# Patient Record
Sex: Female | Born: 1990 | Race: Asian | Hispanic: No | Marital: Married | Smoking: Former smoker
Health system: Southern US, Community
[De-identification: ages and names within clinical notes are randomized; demographics above are authoritative.]

## PROBLEM LIST (undated history)

## (undated) DIAGNOSIS — Z789 Other specified health status: Secondary | ICD-10-CM

## (undated) DIAGNOSIS — N9489 Other specified conditions associated with female genital organs and menstrual cycle: Secondary | ICD-10-CM

## (undated) DIAGNOSIS — I8390 Asymptomatic varicose veins of unspecified lower extremity: Secondary | ICD-10-CM

## (undated) HISTORY — PX: NO PAST SURGERIES: SHX2092

---

## 2015-12-24 ENCOUNTER — Other Ambulatory Visit (HOSPITAL_COMMUNITY): Payer: Self-pay | Admitting: Obstetrics and Gynecology

## 2015-12-24 ENCOUNTER — Encounter (HOSPITAL_COMMUNITY): Payer: Self-pay | Admitting: Obstetrics and Gynecology

## 2015-12-24 DIAGNOSIS — Z3689 Encounter for other specified antenatal screening: Secondary | ICD-10-CM

## 2015-12-24 DIAGNOSIS — O30032 Twin pregnancy, monochorionic/diamniotic, second trimester: Secondary | ICD-10-CM

## 2016-02-19 ENCOUNTER — Encounter (HOSPITAL_COMMUNITY): Payer: Self-pay | Admitting: Obstetrics and Gynecology

## 2016-03-03 ENCOUNTER — Encounter (HOSPITAL_COMMUNITY): Payer: Self-pay | Admitting: *Deleted

## 2016-03-04 ENCOUNTER — Ambulatory Visit (HOSPITAL_COMMUNITY)
Admission: RE | Admit: 2016-03-04 | Discharge: 2016-03-04 | Disposition: A | Discharge status: Home / Self Care | Payer: Medicaid Other | Source: Ambulatory Visit | Attending: Obstetrics and Gynecology | Admitting: Obstetrics and Gynecology

## 2016-03-04 ENCOUNTER — Ambulatory Visit (HOSPITAL_COMMUNITY): Payer: Medicaid Other

## 2016-03-04 ENCOUNTER — Other Ambulatory Visit (HOSPITAL_COMMUNITY): Payer: Self-pay | Admitting: *Deleted

## 2016-03-04 ENCOUNTER — Other Ambulatory Visit (HOSPITAL_COMMUNITY): Payer: Self-pay | Admitting: Obstetrics and Gynecology

## 2016-03-04 ENCOUNTER — Encounter (HOSPITAL_COMMUNITY): Payer: Self-pay

## 2016-03-04 DIAGNOSIS — O30032 Twin pregnancy, monochorionic/diamniotic, second trimester: Secondary | ICD-10-CM

## 2016-03-04 DIAGNOSIS — D582 Other hemoglobinopathies: Secondary | ICD-10-CM

## 2016-03-04 DIAGNOSIS — O30039 Twin pregnancy, monochorionic/diamniotic, unspecified trimester: Secondary | ICD-10-CM

## 2016-03-04 DIAGNOSIS — Z3689 Encounter for other specified antenatal screening: Secondary | ICD-10-CM

## 2016-03-04 DIAGNOSIS — Z3A19 19 weeks gestation of pregnancy: Secondary | ICD-10-CM | POA: Diagnosis not present

## 2016-03-04 DIAGNOSIS — Z363 Encounter for antenatal screening for malformations: Secondary | ICD-10-CM | POA: Diagnosis not present

## 2016-03-04 HISTORY — DX: Asymptomatic varicose veins of unspecified lower extremity: I83.90

## 2016-03-04 HISTORY — DX: Other specified health status: Z78.9

## 2016-03-05 ENCOUNTER — Encounter (HOSPITAL_COMMUNITY): Payer: Self-pay

## 2016-03-18 ENCOUNTER — Ambulatory Visit (HOSPITAL_COMMUNITY)
Admission: RE | Admit: 2016-03-18 | Discharge: 2016-03-18 | Disposition: A | Discharge status: Home / Self Care | Payer: Medicaid Other | Source: Ambulatory Visit | Attending: Obstetrics and Gynecology | Admitting: Obstetrics and Gynecology

## 2016-03-18 ENCOUNTER — Encounter (HOSPITAL_COMMUNITY): Payer: Self-pay

## 2016-03-18 DIAGNOSIS — O30032 Twin pregnancy, monochorionic/diamniotic, second trimester: Secondary | ICD-10-CM | POA: Diagnosis present

## 2016-03-18 DIAGNOSIS — Z363 Encounter for antenatal screening for malformations: Secondary | ICD-10-CM | POA: Insufficient documentation

## 2016-03-18 DIAGNOSIS — Z3A21 21 weeks gestation of pregnancy: Secondary | ICD-10-CM | POA: Diagnosis not present

## 2016-03-18 DIAGNOSIS — O30039 Twin pregnancy, monochorionic/diamniotic, unspecified trimester: Secondary | ICD-10-CM

## 2016-04-01 ENCOUNTER — Encounter (HOSPITAL_COMMUNITY): Payer: Self-pay

## 2016-04-01 ENCOUNTER — Ambulatory Visit (HOSPITAL_COMMUNITY)
Admission: RE | Admit: 2016-04-01 | Discharge: 2016-04-01 | Disposition: A | Discharge status: Home / Self Care | Payer: Medicaid Other | Source: Ambulatory Visit | Attending: Obstetrics and Gynecology | Admitting: Obstetrics and Gynecology

## 2016-04-01 DIAGNOSIS — Z362 Encounter for other antenatal screening follow-up: Secondary | ICD-10-CM | POA: Insufficient documentation

## 2016-04-01 DIAGNOSIS — Z363 Encounter for antenatal screening for malformations: Secondary | ICD-10-CM | POA: Diagnosis not present

## 2016-04-01 DIAGNOSIS — Z3A23 23 weeks gestation of pregnancy: Secondary | ICD-10-CM | POA: Insufficient documentation

## 2016-04-01 DIAGNOSIS — O30032 Twin pregnancy, monochorionic/diamniotic, second trimester: Secondary | ICD-10-CM | POA: Diagnosis not present

## 2016-04-01 DIAGNOSIS — O30039 Twin pregnancy, monochorionic/diamniotic, unspecified trimester: Secondary | ICD-10-CM

## 2016-04-15 ENCOUNTER — Ambulatory Visit (HOSPITAL_COMMUNITY)
Admission: RE | Admit: 2016-04-15 | Discharge: 2016-04-15 | Disposition: A | Discharge status: Home / Self Care | Payer: Medicaid Other | Source: Ambulatory Visit | Attending: Obstetrics and Gynecology | Admitting: Obstetrics and Gynecology

## 2016-04-15 ENCOUNTER — Encounter (HOSPITAL_COMMUNITY): Payer: Self-pay

## 2016-04-15 DIAGNOSIS — O30032 Twin pregnancy, monochorionic/diamniotic, second trimester: Secondary | ICD-10-CM | POA: Insufficient documentation

## 2016-04-15 DIAGNOSIS — Z362 Encounter for other antenatal screening follow-up: Secondary | ICD-10-CM | POA: Insufficient documentation

## 2016-04-15 DIAGNOSIS — Z3A25 25 weeks gestation of pregnancy: Secondary | ICD-10-CM | POA: Insufficient documentation

## 2016-04-15 DIAGNOSIS — O30039 Twin pregnancy, monochorionic/diamniotic, unspecified trimester: Secondary | ICD-10-CM

## 2016-04-15 DIAGNOSIS — Z363 Encounter for antenatal screening for malformations: Secondary | ICD-10-CM | POA: Insufficient documentation

## 2016-04-16 ENCOUNTER — Other Ambulatory Visit (HOSPITAL_COMMUNITY): Payer: Self-pay | Admitting: *Deleted

## 2016-04-16 DIAGNOSIS — O30039 Twin pregnancy, monochorionic/diamniotic, unspecified trimester: Secondary | ICD-10-CM

## 2016-04-29 ENCOUNTER — Encounter (HOSPITAL_COMMUNITY): Payer: Self-pay

## 2016-04-29 ENCOUNTER — Other Ambulatory Visit (HOSPITAL_COMMUNITY): Payer: Self-pay | Admitting: Maternal and Fetal Medicine

## 2016-04-29 ENCOUNTER — Ambulatory Visit (HOSPITAL_COMMUNITY)
Admission: RE | Admit: 2016-04-29 | Discharge: 2016-04-29 | Disposition: A | Discharge status: Home / Self Care | Payer: Medicaid Other | Source: Ambulatory Visit | Attending: Obstetrics and Gynecology | Admitting: Obstetrics and Gynecology

## 2016-04-29 DIAGNOSIS — O30039 Twin pregnancy, monochorionic/diamniotic, unspecified trimester: Secondary | ICD-10-CM

## 2016-04-29 DIAGNOSIS — Z3A27 27 weeks gestation of pregnancy: Secondary | ICD-10-CM | POA: Insufficient documentation

## 2016-04-29 DIAGNOSIS — O30032 Twin pregnancy, monochorionic/diamniotic, second trimester: Secondary | ICD-10-CM | POA: Diagnosis not present

## 2016-05-13 ENCOUNTER — Other Ambulatory Visit (HOSPITAL_COMMUNITY): Payer: Self-pay | Admitting: Maternal and Fetal Medicine

## 2016-05-13 ENCOUNTER — Encounter (HOSPITAL_COMMUNITY): Payer: Self-pay

## 2016-05-13 ENCOUNTER — Ambulatory Visit (HOSPITAL_COMMUNITY)
Admission: RE | Admit: 2016-05-13 | Discharge: 2016-05-13 | Disposition: A | Discharge status: Home / Self Care | Payer: Medicaid Other | Source: Ambulatory Visit | Attending: Obstetrics and Gynecology | Admitting: Obstetrics and Gynecology

## 2016-05-13 DIAGNOSIS — O365931 Maternal care for other known or suspected poor fetal growth, third trimester, fetus 1: Secondary | ICD-10-CM | POA: Diagnosis not present

## 2016-05-13 DIAGNOSIS — O30033 Twin pregnancy, monochorionic/diamniotic, third trimester: Secondary | ICD-10-CM | POA: Insufficient documentation

## 2016-05-13 DIAGNOSIS — O30039 Twin pregnancy, monochorionic/diamniotic, unspecified trimester: Secondary | ICD-10-CM

## 2016-05-13 DIAGNOSIS — Z3A29 29 weeks gestation of pregnancy: Secondary | ICD-10-CM | POA: Insufficient documentation

## 2016-05-13 DIAGNOSIS — O365932 Maternal care for other known or suspected poor fetal growth, third trimester, fetus 2: Secondary | ICD-10-CM | POA: Diagnosis not present

## 2016-05-14 ENCOUNTER — Other Ambulatory Visit (HOSPITAL_COMMUNITY): Payer: Self-pay | Admitting: *Deleted

## 2016-05-20 ENCOUNTER — Encounter (HOSPITAL_COMMUNITY): Payer: Self-pay

## 2016-05-20 ENCOUNTER — Other Ambulatory Visit (HOSPITAL_COMMUNITY): Payer: Medicaid Other

## 2016-05-20 ENCOUNTER — Ambulatory Visit (HOSPITAL_COMMUNITY)
Admission: RE | Admit: 2016-05-20 | Discharge: 2016-05-20 | Disposition: A | Discharge status: Home / Self Care | Payer: Medicaid Other | Source: Ambulatory Visit | Attending: Obstetrics and Gynecology | Admitting: Obstetrics and Gynecology

## 2016-05-20 ENCOUNTER — Other Ambulatory Visit (HOSPITAL_COMMUNITY): Payer: Self-pay | Admitting: Maternal and Fetal Medicine

## 2016-05-20 DIAGNOSIS — O30033 Twin pregnancy, monochorionic/diamniotic, third trimester: Secondary | ICD-10-CM

## 2016-05-20 DIAGNOSIS — O365932 Maternal care for other known or suspected poor fetal growth, third trimester, fetus 2: Secondary | ICD-10-CM | POA: Insufficient documentation

## 2016-05-20 DIAGNOSIS — Z3A30 30 weeks gestation of pregnancy: Secondary | ICD-10-CM | POA: Insufficient documentation

## 2016-05-20 DIAGNOSIS — O365931 Maternal care for other known or suspected poor fetal growth, third trimester, fetus 1: Secondary | ICD-10-CM | POA: Diagnosis not present

## 2016-05-27 ENCOUNTER — Ambulatory Visit (HOSPITAL_COMMUNITY): Payer: Medicaid Other

## 2016-05-27 ENCOUNTER — Encounter (HOSPITAL_COMMUNITY): Payer: Self-pay

## 2016-05-27 ENCOUNTER — Ambulatory Visit (HOSPITAL_COMMUNITY)
Admission: RE | Admit: 2016-05-27 | Discharge: 2016-05-27 | Disposition: A | Discharge status: Home / Self Care | Payer: Medicaid Other | Source: Ambulatory Visit | Attending: Obstetrics and Gynecology | Admitting: Obstetrics and Gynecology

## 2016-05-27 DIAGNOSIS — O365931 Maternal care for other known or suspected poor fetal growth, third trimester, fetus 1: Secondary | ICD-10-CM | POA: Diagnosis present

## 2016-05-27 DIAGNOSIS — O30033 Twin pregnancy, monochorionic/diamniotic, third trimester: Secondary | ICD-10-CM | POA: Diagnosis not present

## 2016-05-27 DIAGNOSIS — Z3A31 31 weeks gestation of pregnancy: Secondary | ICD-10-CM | POA: Insufficient documentation

## 2016-05-27 DIAGNOSIS — O365932 Maternal care for other known or suspected poor fetal growth, third trimester, fetus 2: Secondary | ICD-10-CM | POA: Diagnosis present

## 2016-06-03 ENCOUNTER — Ambulatory Visit (HOSPITAL_COMMUNITY): Payer: Medicaid Other

## 2016-06-03 ENCOUNTER — Encounter (HOSPITAL_COMMUNITY): Payer: Self-pay

## 2016-06-03 ENCOUNTER — Ambulatory Visit (HOSPITAL_COMMUNITY)
Admission: RE | Admit: 2016-06-03 | Discharge: 2016-06-03 | Disposition: A | Discharge status: Home / Self Care | Payer: Medicaid Other | Source: Ambulatory Visit | Attending: Obstetrics and Gynecology | Admitting: Obstetrics and Gynecology

## 2016-06-03 DIAGNOSIS — O365931 Maternal care for other known or suspected poor fetal growth, third trimester, fetus 1: Secondary | ICD-10-CM | POA: Insufficient documentation

## 2016-06-03 DIAGNOSIS — Z3A32 32 weeks gestation of pregnancy: Secondary | ICD-10-CM | POA: Diagnosis not present

## 2016-06-03 DIAGNOSIS — O30033 Twin pregnancy, monochorionic/diamniotic, third trimester: Secondary | ICD-10-CM | POA: Insufficient documentation

## 2016-06-03 DIAGNOSIS — O365932 Maternal care for other known or suspected poor fetal growth, third trimester, fetus 2: Secondary | ICD-10-CM | POA: Diagnosis not present

## 2016-06-03 DIAGNOSIS — O30039 Twin pregnancy, monochorionic/diamniotic, unspecified trimester: Secondary | ICD-10-CM

## 2016-06-03 NOTE — Addendum Note (Signed)
Encounter addended by: Vivien Rotaachael H Ruie Sendejo, RT on: 06/03/2016  4:21 PM<BR>    Actions taken: Imaging Exam ended

## 2016-06-03 NOTE — Addendum Note (Signed)
Encounter addended by: Vivien Rotaachael H Vale Peraza, RT on: 06/03/2016  4:20 PM<BR>    Actions taken: Imaging Exam ended

## 2016-06-10 ENCOUNTER — Encounter (HOSPITAL_COMMUNITY): Payer: Self-pay

## 2016-06-10 ENCOUNTER — Other Ambulatory Visit (HOSPITAL_COMMUNITY): Payer: Medicaid Other

## 2016-06-10 ENCOUNTER — Ambulatory Visit (HOSPITAL_COMMUNITY): Payer: Medicaid Other

## 2016-06-10 ENCOUNTER — Ambulatory Visit (HOSPITAL_COMMUNITY)
Admission: RE | Admit: 2016-06-10 | Discharge: 2016-06-10 | Disposition: A | Discharge status: Home / Self Care | Payer: Medicaid Other | Source: Ambulatory Visit | Attending: Obstetrics and Gynecology | Admitting: Obstetrics and Gynecology

## 2016-06-10 ENCOUNTER — Other Ambulatory Visit (HOSPITAL_COMMUNITY): Payer: Self-pay | Admitting: Maternal and Fetal Medicine

## 2016-06-10 DIAGNOSIS — Z3A33 33 weeks gestation of pregnancy: Secondary | ICD-10-CM | POA: Diagnosis not present

## 2016-06-10 DIAGNOSIS — O365990 Maternal care for other known or suspected poor fetal growth, unspecified trimester, not applicable or unspecified: Secondary | ICD-10-CM

## 2016-06-10 DIAGNOSIS — O30033 Twin pregnancy, monochorionic/diamniotic, third trimester: Secondary | ICD-10-CM | POA: Diagnosis present

## 2016-06-10 DIAGNOSIS — O365931 Maternal care for other known or suspected poor fetal growth, third trimester, fetus 1: Secondary | ICD-10-CM | POA: Diagnosis not present

## 2016-06-10 DIAGNOSIS — O365932 Maternal care for other known or suspected poor fetal growth, third trimester, fetus 2: Secondary | ICD-10-CM | POA: Diagnosis not present

## 2016-06-17 ENCOUNTER — Other Ambulatory Visit (HOSPITAL_COMMUNITY): Payer: Medicaid Other

## 2016-06-17 ENCOUNTER — Encounter (HOSPITAL_COMMUNITY): Payer: Self-pay

## 2016-06-17 ENCOUNTER — Other Ambulatory Visit (HOSPITAL_COMMUNITY): Payer: Self-pay | Admitting: Maternal and Fetal Medicine

## 2016-06-17 ENCOUNTER — Ambulatory Visit (HOSPITAL_COMMUNITY)
Admission: RE | Admit: 2016-06-17 | Discharge: 2016-06-17 | Disposition: A | Discharge status: Home / Self Care | Payer: Medicaid Other | Source: Ambulatory Visit | Attending: Obstetrics and Gynecology | Admitting: Obstetrics and Gynecology

## 2016-06-17 DIAGNOSIS — O365931 Maternal care for other known or suspected poor fetal growth, third trimester, fetus 1: Secondary | ICD-10-CM | POA: Insufficient documentation

## 2016-06-17 DIAGNOSIS — Z3A34 34 weeks gestation of pregnancy: Secondary | ICD-10-CM | POA: Insufficient documentation

## 2016-06-17 DIAGNOSIS — Z3689 Encounter for other specified antenatal screening: Secondary | ICD-10-CM

## 2016-06-17 DIAGNOSIS — Z3A35 35 weeks gestation of pregnancy: Secondary | ICD-10-CM

## 2016-06-17 DIAGNOSIS — O365932 Maternal care for other known or suspected poor fetal growth, third trimester, fetus 2: Secondary | ICD-10-CM | POA: Insufficient documentation

## 2016-06-17 DIAGNOSIS — O30033 Twin pregnancy, monochorionic/diamniotic, third trimester: Secondary | ICD-10-CM | POA: Diagnosis present

## 2016-06-24 ENCOUNTER — Encounter (HOSPITAL_COMMUNITY): Payer: Self-pay

## 2016-06-24 ENCOUNTER — Other Ambulatory Visit (HOSPITAL_COMMUNITY): Payer: Medicaid Other

## 2016-06-24 ENCOUNTER — Ambulatory Visit (HOSPITAL_COMMUNITY)
Admission: RE | Admit: 2016-06-24 | Discharge: 2016-06-24 | Disposition: A | Discharge status: Home / Self Care | Payer: Medicaid Other | Source: Ambulatory Visit | Attending: Obstetrics and Gynecology | Admitting: Obstetrics and Gynecology

## 2016-06-24 DIAGNOSIS — Z3689 Encounter for other specified antenatal screening: Secondary | ICD-10-CM

## 2016-06-24 DIAGNOSIS — O365931 Maternal care for other known or suspected poor fetal growth, third trimester, fetus 1: Secondary | ICD-10-CM | POA: Diagnosis not present

## 2016-06-24 DIAGNOSIS — O30033 Twin pregnancy, monochorionic/diamniotic, third trimester: Secondary | ICD-10-CM | POA: Diagnosis not present

## 2016-06-24 DIAGNOSIS — O365932 Maternal care for other known or suspected poor fetal growth, third trimester, fetus 2: Secondary | ICD-10-CM | POA: Diagnosis not present

## 2016-06-24 DIAGNOSIS — Z3A35 35 weeks gestation of pregnancy: Secondary | ICD-10-CM | POA: Insufficient documentation

## 2016-11-25 ENCOUNTER — Encounter (HOSPITAL_BASED_OUTPATIENT_CLINIC_OR_DEPARTMENT_OTHER): Payer: Self-pay | Admitting: *Deleted

## 2016-11-25 ENCOUNTER — Emergency Department (HOSPITAL_BASED_OUTPATIENT_CLINIC_OR_DEPARTMENT_OTHER)
Admission: EM | Admit: 2016-11-25 | Discharge: 2016-11-25 | Disposition: A | Discharge status: Home / Self Care | Payer: Medicaid Other | Attending: Emergency Medicine | Admitting: Emergency Medicine

## 2016-11-25 DIAGNOSIS — R1032 Left lower quadrant pain: Secondary | ICD-10-CM | POA: Insufficient documentation

## 2016-11-25 DIAGNOSIS — N760 Acute vaginitis: Secondary | ICD-10-CM | POA: Insufficient documentation

## 2016-11-25 DIAGNOSIS — M549 Dorsalgia, unspecified: Secondary | ICD-10-CM | POA: Diagnosis present

## 2016-11-25 DIAGNOSIS — R509 Fever, unspecified: Secondary | ICD-10-CM | POA: Diagnosis not present

## 2016-11-25 DIAGNOSIS — B9689 Other specified bacterial agents as the cause of diseases classified elsewhere: Secondary | ICD-10-CM

## 2016-11-25 LAB — URINALYSIS, MICROSCOPIC (REFLEX): RBC / HPF: NONE SEEN RBC/hpf (ref 0–5)

## 2016-11-25 LAB — WET PREP, GENITAL
Sperm: NONE SEEN
Trich, Wet Prep: NONE SEEN
YEAST WET PREP: NONE SEEN

## 2016-11-25 LAB — URINALYSIS, ROUTINE W REFLEX MICROSCOPIC
BILIRUBIN URINE: NEGATIVE
Glucose, UA: NEGATIVE mg/dL
HGB URINE DIPSTICK: NEGATIVE
Ketones, ur: NEGATIVE mg/dL
Nitrite: NEGATIVE
PH: 7.5 (ref 5.0–8.0)
Protein, ur: NEGATIVE mg/dL
SPECIFIC GRAVITY, URINE: 1.018 (ref 1.005–1.030)

## 2016-11-25 LAB — PREGNANCY, URINE: Preg Test, Ur: NEGATIVE

## 2016-11-25 MED ORDER — METRONIDAZOLE 500 MG PO TABS: 500.0000 mg | ORAL_TABLET | Freq: Two times a day (BID) | ORAL | 0 refills | Status: AC

## 2016-11-25 NOTE — ED Triage Notes (Signed)
Pt reports ongoing back and lower abdominal pain x 4 months since vaginal delivery. States that she has seen her GYN for issue and they report that nothing is wrong and that it will improve. States that she had sex last night and had small amount of bleeding.

## 2016-11-25 NOTE — ED Notes (Signed)
ED Provider at bedside. 

## 2016-11-25 NOTE — Discharge Instructions (Signed)
You were evaluated for abdominal pain and back pain in the ED today and was found to have a vaginal infection called bacterial vaginosis.  Please take metronidazole for the next 7 days and follow up with your OBGYN as outpatient.

## 2016-11-25 NOTE — ED Provider Notes (Signed)
[facility]   CSN: [id number] Arrival date & time: 11/25/16  1823     History   Chief Complaint Chief Complaint  Patient presents with  . Back Pain    HPI Ashlee Bowen is a 26 y.o. female.  Patient presents with back pain and abdominal pain in LLQ, constant for the past 4 months ever since delivery of her twin in March, sometimes worsens with lifting heavy things or bending down, causes her to have trouble sleeping. Also with yellowish malodorous vaginal discharge for 2 weeks that was initially itchy.  No known STD exposure. States her husbands also noticed post sexual intercourse bleeding but she is also expecting to start her period soon. She also states that she has had some fevers only at night for the last 3 days with Tmax 101.18F but does not currently have any fevers/chills. No dysuria, no urgency/frequency.       Past Medical History:  Diagnosis Date  . Medical history non-contributory   . Varicose vein of leg     There are no active problems to display for this patient.   Past Surgical History:  Procedure Laterality Date  . NO PAST SURGERIES      OB History    Gravida Para Term Preterm AB Living   3 1 1   1 1    SAB TAB Ectopic Multiple Live Births   1               Home Medications    Prior to Admission medications   Medication Sig Start Date End Date Taking? Authorizing Provider  IRON PO Take by mouth.    [provider]  Prenatal Vit-Fe Fumarate-FA (PRENATAL VITAMIN PO) Take by mouth.    [provider]  Terconazole (TERAZOL 3 VA) Place vaginally.    [provider]    Family History History reviewed. No pertinent family history.  Social History Social History  Substance Use Topics  . Smoking status: Never Smoker  . Smokeless tobacco: Never Used  . Alcohol use No     Allergies   Patient has no known allergies.   Review of Systems Review of Systems  Constitutional: Positive for fever.  Negative for chills.  HENT: Negative for congestion, rhinorrhea and sore throat.   Respiratory: Negative for chest tightness and shortness of breath.   Cardiovascular: Negative for chest pain.  Gastrointestinal: Positive for abdominal pain (LLQ ). Negative for constipation, diarrhea, nausea and vomiting.  Genitourinary: Positive for vaginal bleeding and vaginal discharge. Negative for dysuria, frequency, urgency and vaginal pain.  Musculoskeletal: Positive for back pain (L sided).  Skin: Negative for rash.  Neurological: Negative for dizziness, light-headedness and headaches.     Physical Exam Updated Vital Signs BP 115/71 (BP Location: Right Arm)   Pulse 62   Temp 98.8 F (37.1 C) (Oral)   Resp 16   Ht 5\' 3"  (1.6 m)   Wt 62 kg (136 lb 11 oz)   LMP 10/19/2015 (Approximate)   SpO2 100%   Breastfeeding? Unknown   BMI 24.21 kg/m   Physical Exam  Constitutional: She is oriented to person, place, and time. She appears well-developed and well-nourished. No distress.  HENT:  Head: Normocephalic and atraumatic.  Nose: Nose normal.  Mouth/Throat: Oropharynx is clear and moist.  Eyes: Conjunctivae and EOM are normal.  Neck: Normal range of motion. Neck supple.  Cardiovascular: Normal rate, regular rhythm, normal heart sounds and intact distal pulses.   No murmur heard.  Pulmonary/Chest: Effort normal and breath sounds normal. No respiratory distress.  Abdominal: Soft. Bowel sounds are normal. She exhibits no distension and no mass. There is no tenderness. There is no rebound and no guarding.  No CVA tenderness  Genitourinary: Vaginal discharge (yellow and vaginal bleeding) found.  Musculoskeletal: Normal range of motion.  Neurological: She is alert and oriented to person, place, and time.  Skin: Skin is warm and dry. Capillary refill takes less than 2 seconds.  Psychiatric: She has a normal mood and affect.     ED Treatments / Results  Labs (all labs ordered are listed, but  only abnormal results are displayed) Labs Reviewed  WET PREP, GENITAL - Abnormal; Notable for the following:       Result Value   Clue Cells Wet Prep HPF POC PRESENT (*)    WBC, Wet Prep HPF POC MANY (*)    All other components within normal limits  URINALYSIS, ROUTINE W REFLEX MICROSCOPIC - Abnormal; Notable for the following:    APPearance CLOUDY (*)    Leukocytes, UA SMALL (*)    All other components within normal limits  URINALYSIS, MICROSCOPIC (REFLEX) - Abnormal; Notable for the following:    Bacteria, UA RARE (*)    Squamous Epithelial / LPF 0-5 (*)    All other components within normal limits  PREGNANCY, URINE  GC/CHLAMYDIA PROBE AMP ([place]) NOT AT [facility]    EKG  EKG Interpretation None       Radiology No results found.  Procedures Procedures (including critical care time)  Medications Ordered in ED Medications - No data to display   Initial Impression / Assessment and Plan / ED Course  I have reviewed the triage vital signs and the nursing notes.  Pertinent labs & imaging results that were available during my care of the patient were reviewed by me and considered in my medical decision making (see chart for details).   Patient with chronic back and abdominal pain since delivery of her twins in March 2018. Now with vaginal discharge with wet prep c/w bacterial vaginosis. GC/chlamydia collected. Although she had h/o fever at home she is afebrile here in ED, has no CVA tenderness, no urinary symptoms and UA is not concerning for UTI. Rx for metronidazole given and patient instructed to follow up with her OBGYN.   Final Clinical Impressions(s) / ED Diagnoses   Final diagnoses:  Bacterial vaginosis    New Prescriptions Discharge Medication List as of 11/25/2016 10:50 PM    START taking these medications   Details  metroNIDAZOLE (FLAGYL) 500 MG tablet Take 1 tablet (500 mg total) by mouth 2 (two) times daily., Starting Wed 11/25/2016, Until Wed 12/02/2016,  Print         Leland Her, DO 11/26/16 0007    Tegeler, Canary Brim, MD 11/27/16 2147

## 2016-11-26 LAB — GC/CHLAMYDIA PROBE AMP (~~LOC~~) NOT AT ARMC
Chlamydia: NEGATIVE
Neisseria Gonorrhea: NEGATIVE

## 2018-03-03 IMAGING — US US MFM OB LIMITED
1 series · 14 of 28 positions shown · non-contrast
Comparison: none

[Series 1: us mfm ob limited · 61 acquisitions, 14 frames shown]
[im 3/61]
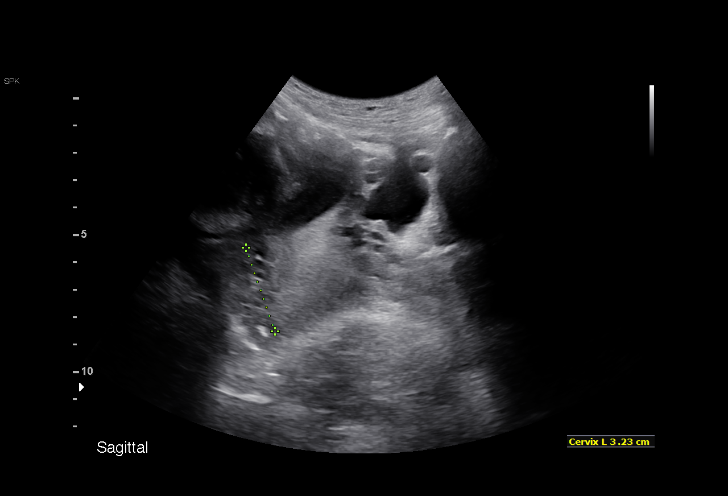
[im 7/61]
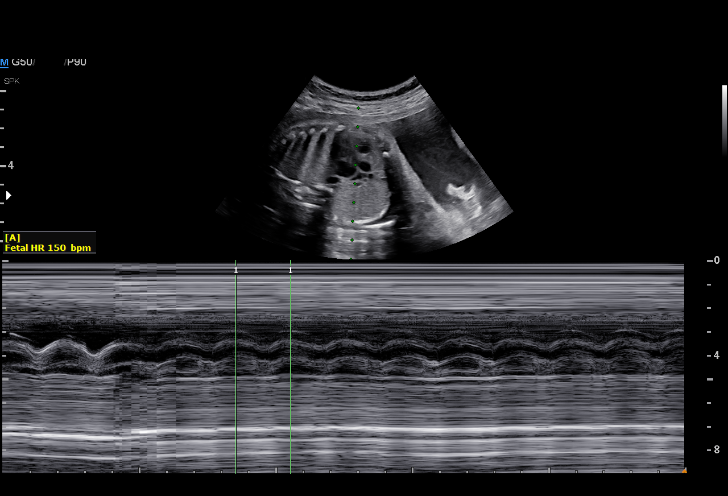
[im 12/61]
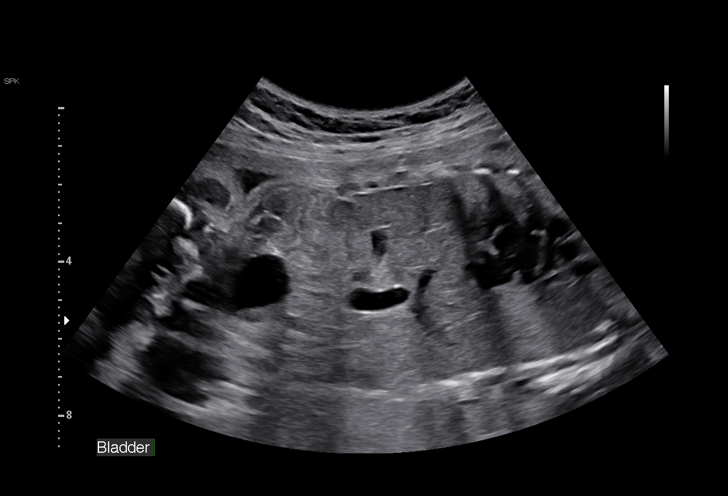
[im 16/61]
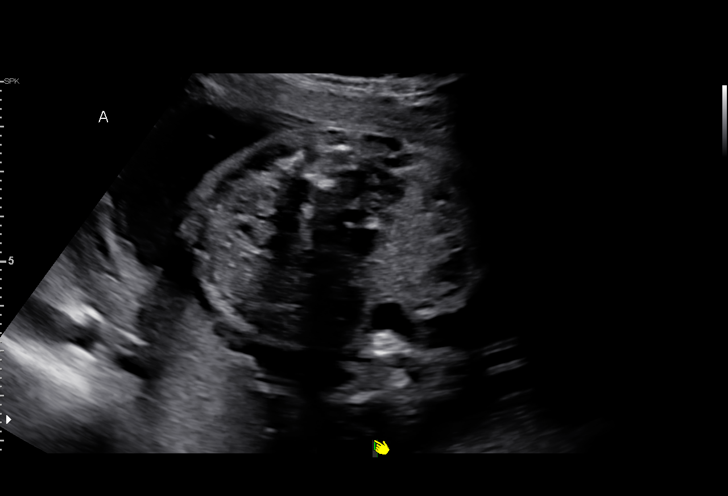
[im 21/61]
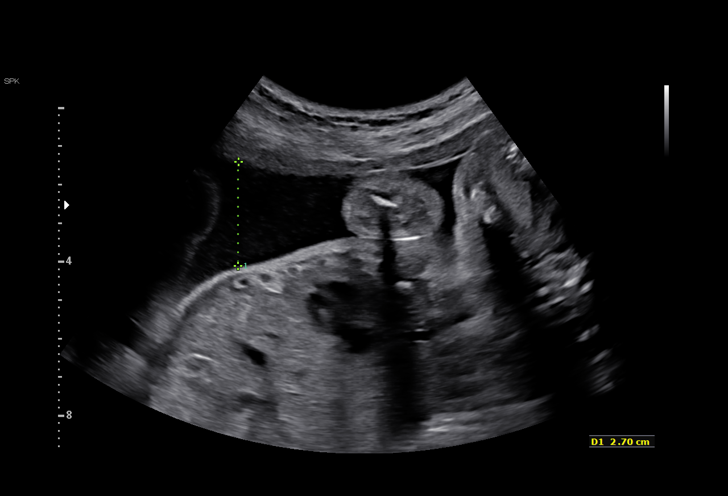
[im 25/61]
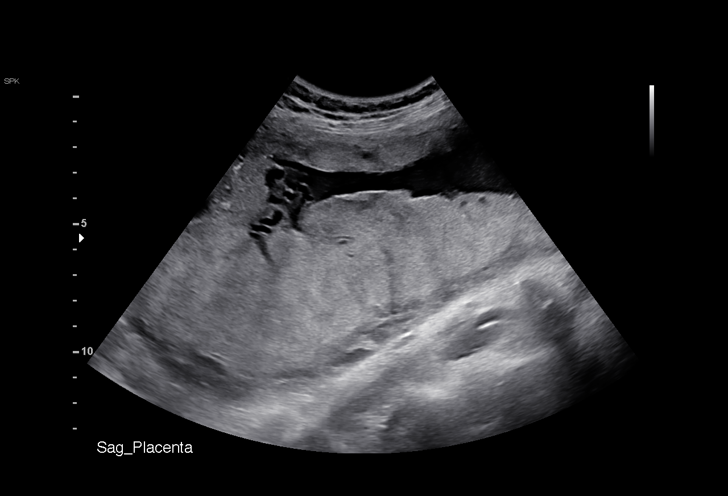
[im 29/61]
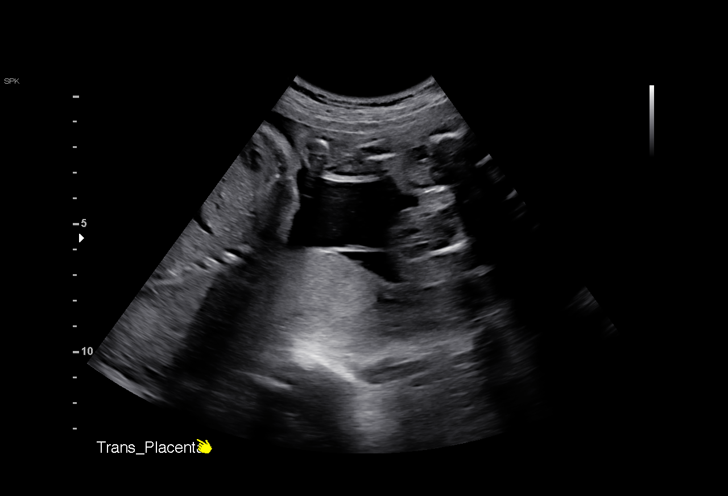
[im 34/61]
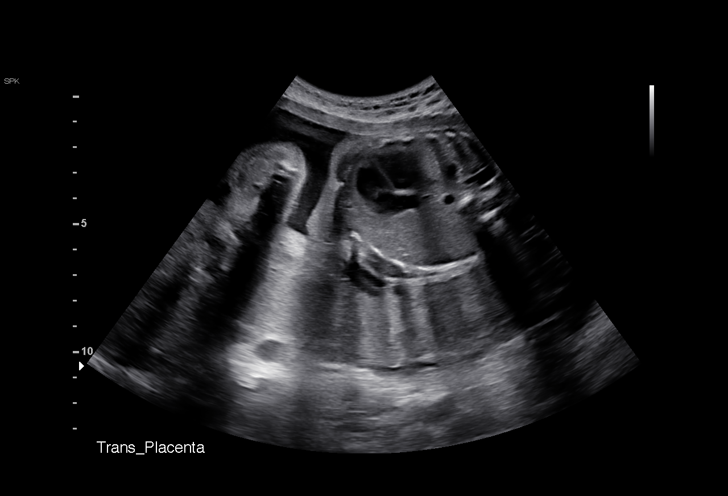
[im 38/61]
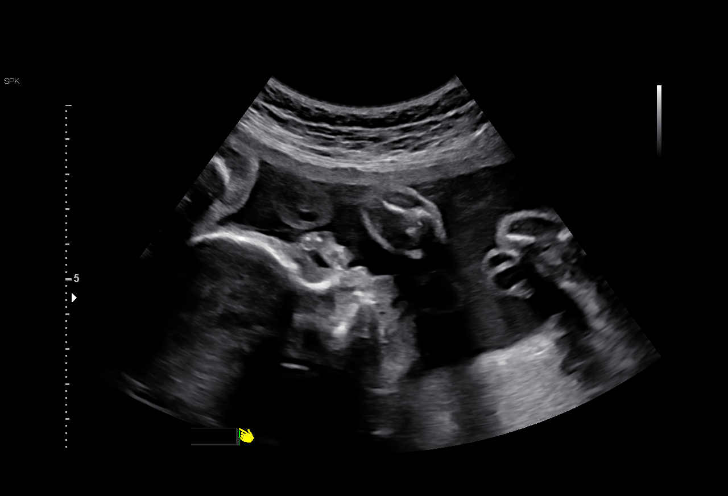
[im 43/61]
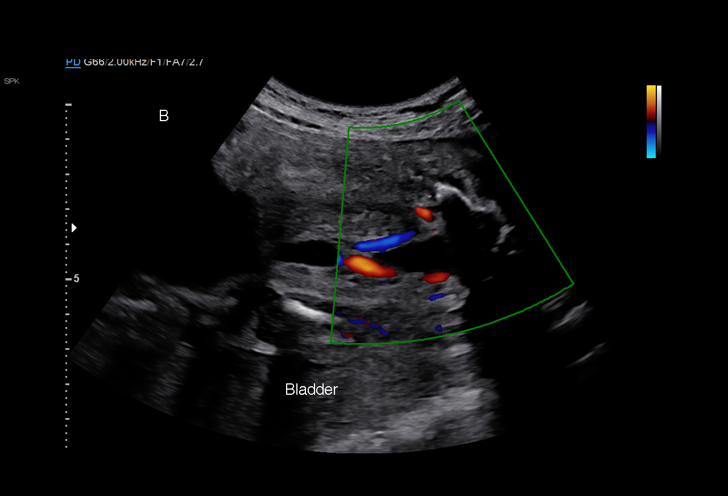
[im 47/61]
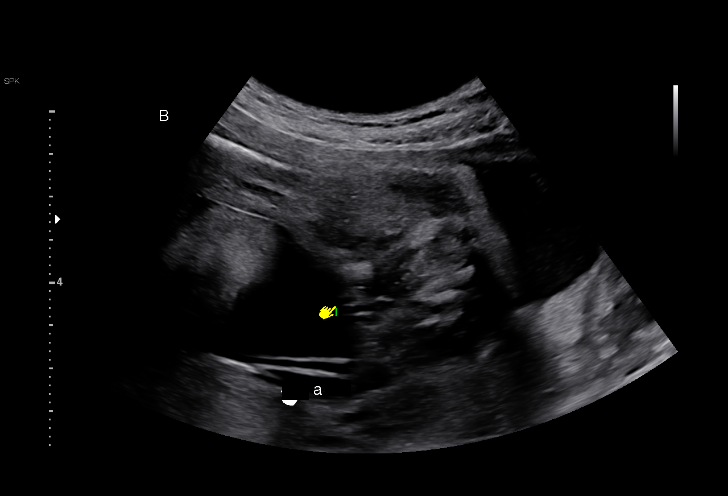
[im 52/61]
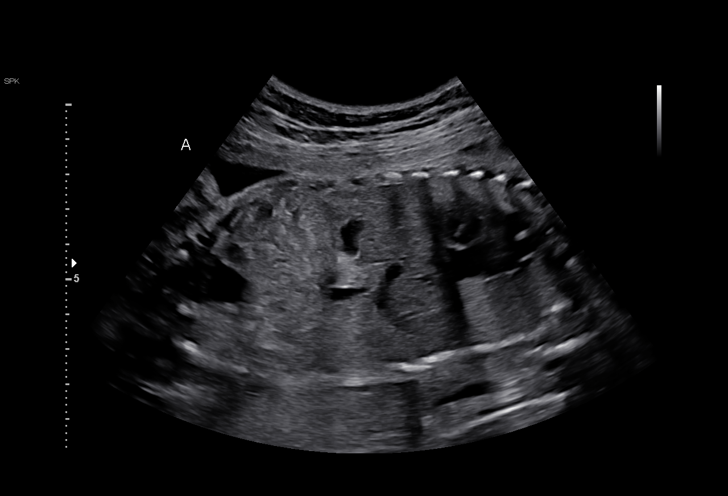
[im 56/61]
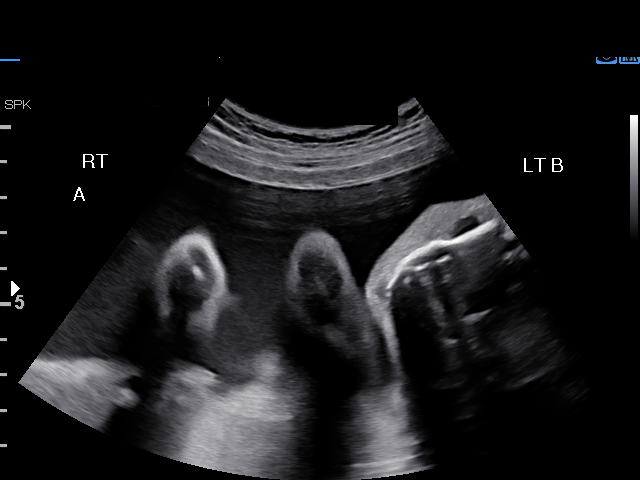
[im 61/61]
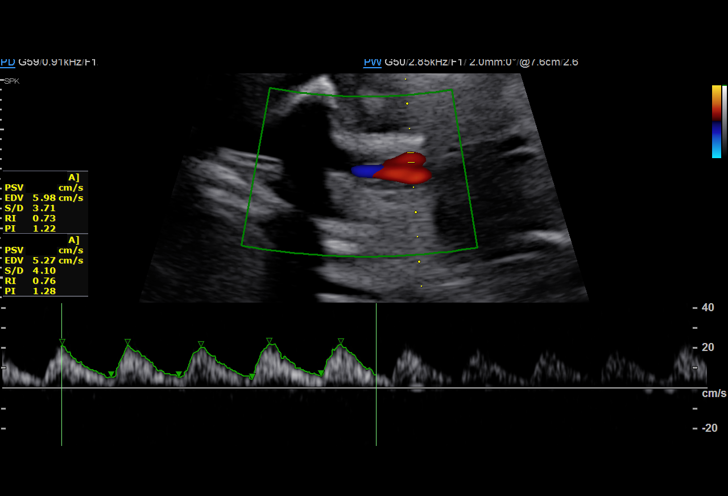

[14 of 28 positions shown; findings below may reference images not displayed]

[HOSPITAL],[HOSPITAL]

1  SKYLAR RANSON            [id number]      [id number]     [id number]
Indications

27 weeks gestation of pregnancy
Twin pregnancy, Renesha/Tripti, second trimester
OB History

Blood Type:            Height:         Weight (lb):  124      BMI:
Gravidity:    3         Term:   1        Prem:   0        SAB:   1
TOP:          0       Ectopic:  0        Living: 1
Fetal Evaluation (Fetus A)

Num Of Fetuses:     2
Fetal Heart         150
Rate(bpm):
Cardiac Activity:   Observed
Fetal Lie:          Maternal right side
Presentation:       Cephalic
Placenta:           Posterior, above cervical os

Amniotic Fluid
AFI FV:      Subjectively within normal limits

Largest Pocket(cm)
3.99
Gestational Age (Fetus A)

LMP:           27w 4d       Date:   10/19/15                 EDD:   07/25/16
Best:          27w 4d    Det. By:   LMP  (10/19/15)          EDD:   07/25/16
Doppler - Fetal Vessels (Fetus A)
Umbilical Artery
S/D     %tile
3.42       64

Fetal Evaluation (Fetus B)

Num Of Fetuses:     2
Fetal Heart         145
Rate(bpm):
Cardiac Activity:   Observed
Fetal Lie:          Maternal left side
Presentation:       Breech
Placenta:           Posterior, above cervical os

Amniotic Fluid
AFI FV:      Subjectively within normal limits

Largest Pocket(cm)
3.15
Gestational Age (Fetus B)

LMP:           27w 4d       Date:   10/19/15                 EDD:   07/25/16
Best:          27w 4d    Det. By:   LMP  (10/19/15)          EDD:   07/25/16
Anatomy (Fetus B)

Cranium:               Appears normal         Aortic Arch:            Previously seen
Cavum:                 Previously seen        Ductal Arch:            Previously seen
Ventricles:            Appears normal         Diaphragm:              Appears normal
Choroid Plexus:        Previously seen        Stomach:                Appears normal, left
sided
Cerebellum:            Previously seen        Abdomen:                Previously seen
Posterior Fossa:       Previously seen        Abdominal Wall:         Previously seen
Nuchal Fold:           Previously seen        Cord Vessels:           Previously seen
Face:                  Orbits and profile     Kidneys:                Appear normal
previously seen
Lips:                  Previously seen        Bladder:                Appears normal
Thoracic:              Previously seen        Spine:                  Previously seen
Heart:                 Appears normal         Upper Extremities:      Previously seen
(4CH, axis, and situs
RVOT:                  Previously seen        Lower Extremities:      Previously seen
LVOT:                  Previously seen

Other:  Female gender previously seen. Heels and 5th digit previously seen.
Doppler - Fetal Vessels (Fetus B)

Umbilical Artery
S/D     %tile
4.1       90

Cervix Uterus Adnexa
Cervix
Length:           2.88  cm.
Normal appearance by transabdominal scan.
Impression

Monochorionic/diamniotic twin pregnancy at 27+0 weeks
Normal amniotic fluid volume x 2
No evidence of TTTS
(UA dopplers were normal for this GA)
Recommendations

Follow-up ultrasound for growth in 2 weeks

## 2018-04-14 IMAGING — US US MFM FETAL BPP W/O NON-STRESS
1 series · 14 of 28 positions shown · non-contrast
Comparison: none

[Series 1: us mfm fetal bpp w/o non-stress · 65 acquisitions, 14 frames shown]
[im 3/65]
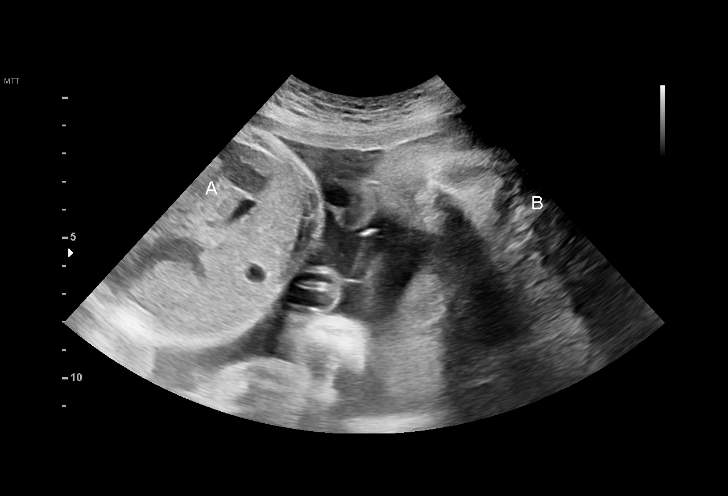
[im 8/65]
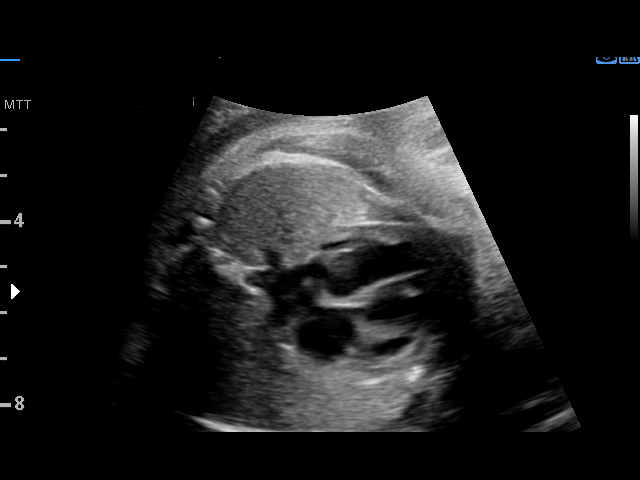
[im 12/65]
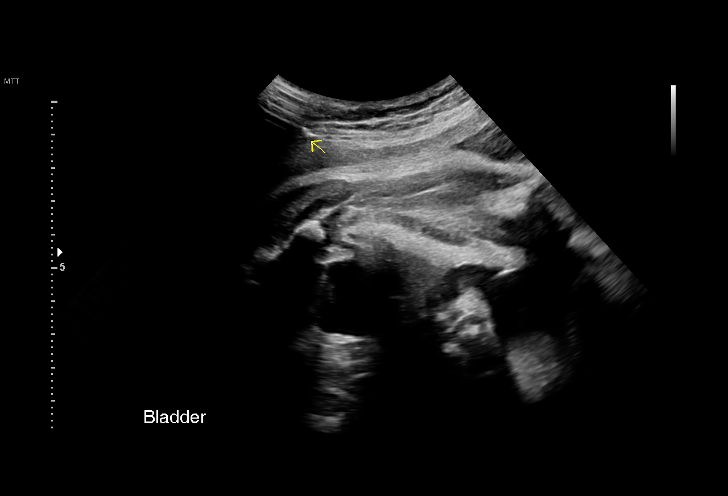
[im 17/65]
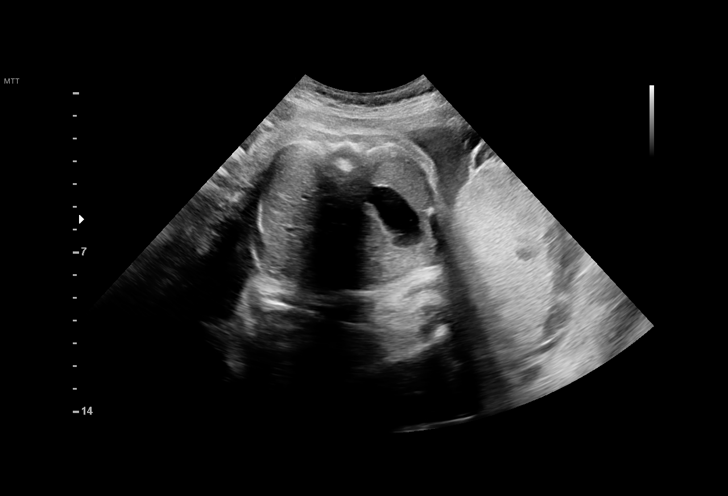
[im 22/65]
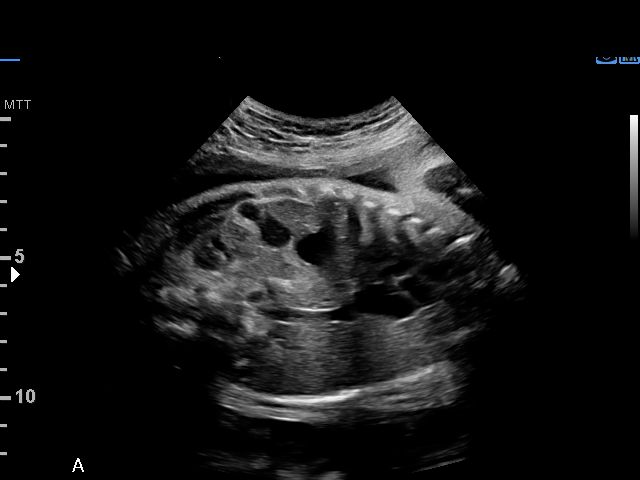
[im 27/65]
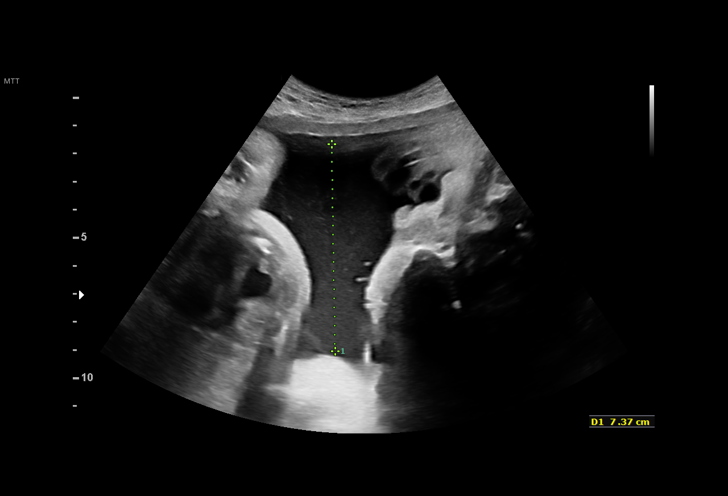
[im 31/65]
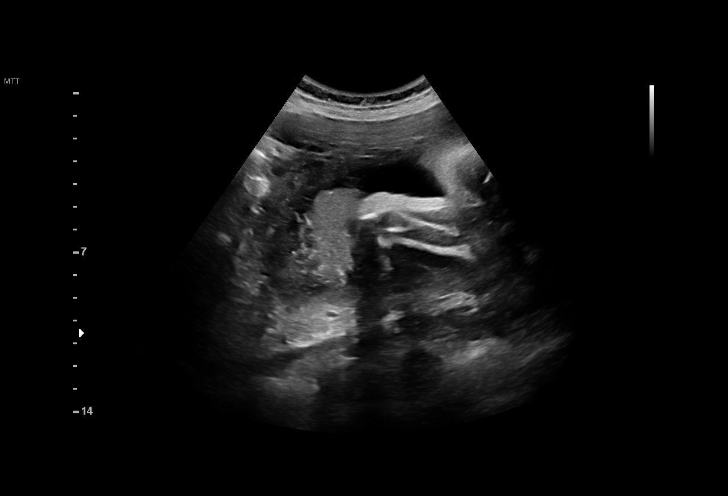
[im 36/65]
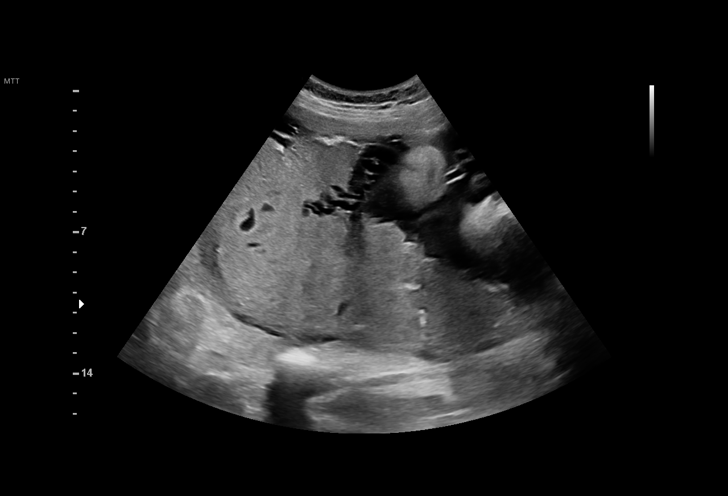
[im 41/65]
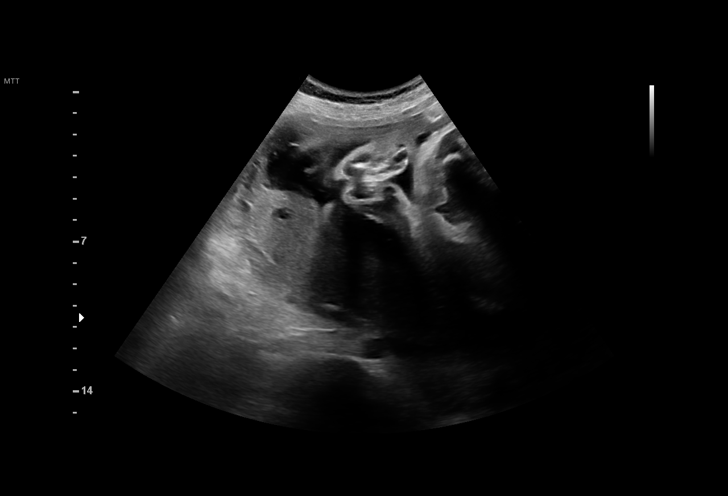
[im 46/65]
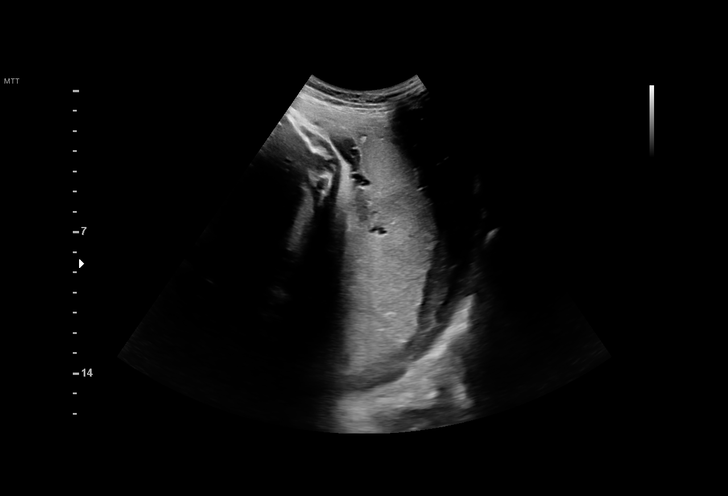
[im 50/65]
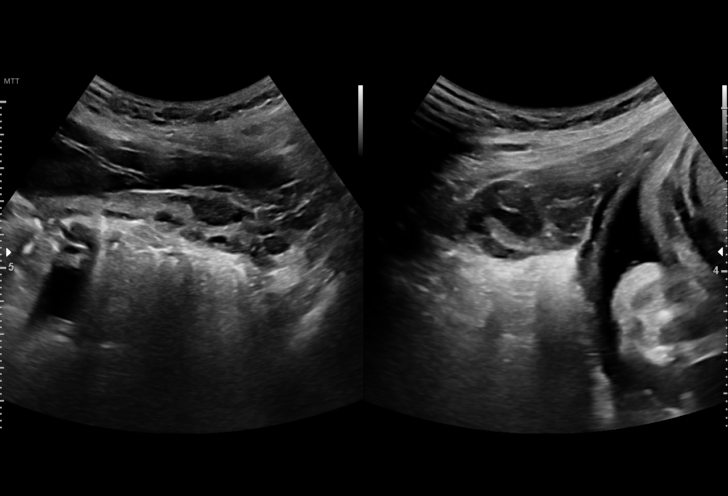
[im 55/65]
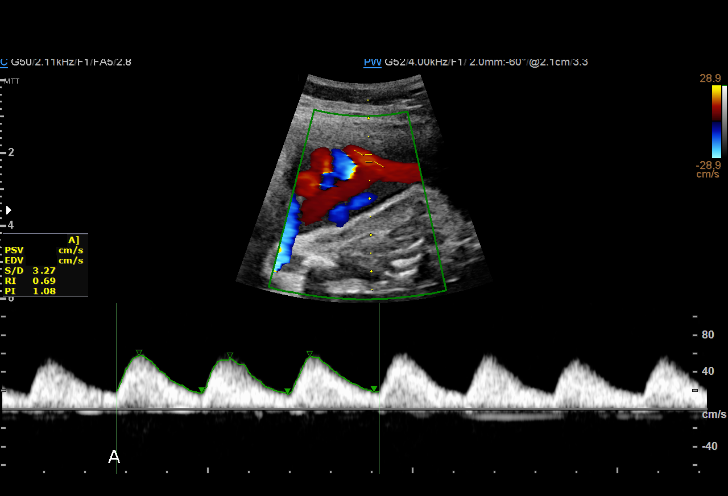
[im 60/65]
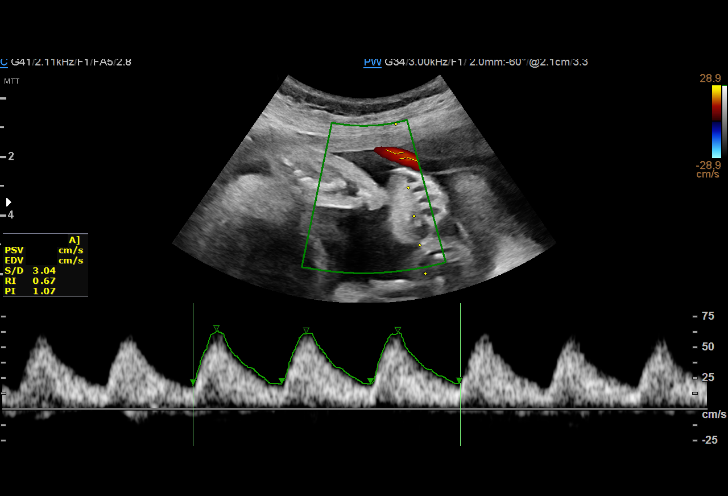
[im 65/65]
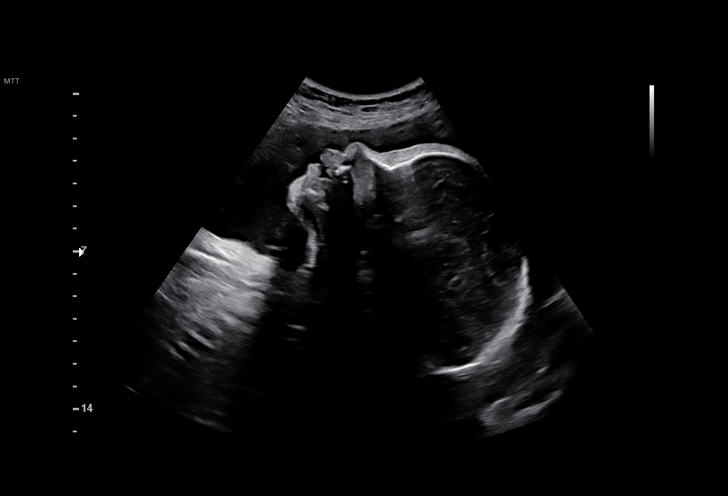

[14 of 28 positions shown; findings below may reference images not displayed]

[HOSPITAL],[HOSPITAL]

GESTATION
EVAL

1  AGUIRRE NICKELS            [id number]      [id number]     [id number]
2  AGUIRRE NICKELS            [id number]      [id number]     [id number]
3  AGUIRRE NICKELS            [id number]      [id number]     [id number]
4  AGUIRRE NICKELS            [id number]      [id number]     [id number]
Indications

33 weeks gestation of pregnancy
Twin pregnancy, Mario Sergio/Calme, third trimester
Maternal care for known or suspected poor
fetal growth, third trimester, fetus 1
Maternal care for known or suspected poor
fetal growth, third trimester, fetus 2
OB History

Blood Type:            Height:         Weight (lb):  124      BMI:
Gravidity:    3         Term:   1        Prem:   0        SAB:   1
TOP:          0       Ectopic:  0        Living: 1
Fetal Evaluation (Fetus A)

Num Of Fetuses:     2
Fetal Heart         142
Rate(bpm):
Cardiac Activity:   Observed
Fetal Lie:          Maternal right side
Presentation:       Cephalic
Placenta:           Posterior, above cervical os
Membrane Desc:      Dividing Membrane seen - Monochorionic
Amniotic Fluid
AFI FV:      Subjectively within normal limits

Largest Pocket(cm)
4.5
Biophysical Evaluation (Fetus A)

Amniotic F.V:   Pocket => 2 cm two         F. Tone:        Observed
planes
F. Movement:    Observed                   Score:          [DATE]
F. Breathing:   Observed
Gestational Age (Fetus A)

Best:          33w 4d    Det. By:   Early Ultrasound         EDD:   07/25/16
(12/13/15)
Doppler - Fetal Vessels (Fetus A)

Umbilical Artery
S/D     %tile     RI              PI              PSV    ADFV    RDFV
(cm/s)
2.95       68   0.66             1.03             62.08      No      No

Fetal Evaluation (Fetus B)

Num Of Fetuses:     2
Fetal Heart         148
Rate(bpm):
Cardiac Activity:   Observed
Fetal Lie:          Maternal left side
Presentation:       Cephalic
Placenta:           Posterior, above cervical os
Membrane Desc:      Dividing Membrane seen - Monochorionic

Amniotic Fluid
AFI FV:      Subjectively within normal limits

Largest Pocket(cm)
7.3
Biophysical Evaluation (Fetus B)

Amniotic F.V:   Pocket => 2 cm two         F. Tone:        Observed
planes
F. Movement:    Observed                   Score:          [DATE]
F. Breathing:   Observed
Gestational Age (Fetus B)

Best:          33w 4d    Det. By:   Early Ultrasound         EDD:   07/25/16
(12/13/15)
Doppler - Fetal Vessels (Fetus B)

Umbilical Artery
S/D     %tile     RI              PI              PSV    ADFV    RDFV
(cm/s)
2.52       48     0.6            0.88             79.94      No      No

Cervix Uterus Adnexa

Cervix
Not visualized (advanced GA >29wks)

Uterus
No abnormality visualized.

Left Ovary
Size(cm)     3.27  x    1.55   x  1.49      Vol(ml): 4
Within normal limits. No adnexal mass visualized.

Right Ovary
Size(cm)     4.02  x    2.43   x  1.56      Vol(ml): 8
Within normal limits. No adnexal mass visualized.

Cul De Sac:   No free fluid seen.

Adnexa:       No abnormality visualized.
Impression

Monochorionic/diamniotic twin pregnancy at 33+4 weeks
Cephalic/cephalic presentation
Normal amniotic fluid volume  x 2
UA dopplers were normal for this GA x 2
BPP [DATE] x 2
Recommendations

Continue weekly BPPs and UA dopplers
Growth US in 2 weeks
Deliver by 36 weeks

## 2019-07-26 ENCOUNTER — Emergency Department (HOSPITAL_BASED_OUTPATIENT_CLINIC_OR_DEPARTMENT_OTHER): Payer: Medicaid Other

## 2019-07-26 ENCOUNTER — Encounter (HOSPITAL_BASED_OUTPATIENT_CLINIC_OR_DEPARTMENT_OTHER): Payer: Self-pay

## 2019-07-26 ENCOUNTER — Emergency Department (HOSPITAL_BASED_OUTPATIENT_CLINIC_OR_DEPARTMENT_OTHER)
Admission: EM | Admit: 2019-07-26 | Discharge: 2019-07-26 | Disposition: A | Discharge status: Home / Self Care | Payer: Medicaid Other | Attending: Emergency Medicine | Admitting: Emergency Medicine

## 2019-07-26 ENCOUNTER — Other Ambulatory Visit: Payer: Self-pay

## 2019-07-26 DIAGNOSIS — M7918 Myalgia, other site: Secondary | ICD-10-CM | POA: Insufficient documentation

## 2019-07-26 DIAGNOSIS — Z20822 Contact with and (suspected) exposure to covid-19: Secondary | ICD-10-CM | POA: Insufficient documentation

## 2019-07-26 DIAGNOSIS — R0789 Other chest pain: Secondary | ICD-10-CM | POA: Insufficient documentation

## 2019-07-26 DIAGNOSIS — J3489 Other specified disorders of nose and nasal sinuses: Secondary | ICD-10-CM | POA: Diagnosis not present

## 2019-07-26 DIAGNOSIS — R0602 Shortness of breath: Secondary | ICD-10-CM | POA: Diagnosis not present

## 2019-07-26 DIAGNOSIS — Z87891 Personal history of nicotine dependence: Secondary | ICD-10-CM | POA: Diagnosis not present

## 2019-07-26 DIAGNOSIS — Z79899 Other long term (current) drug therapy: Secondary | ICD-10-CM | POA: Insufficient documentation

## 2019-07-26 DIAGNOSIS — R05 Cough: Secondary | ICD-10-CM | POA: Diagnosis not present

## 2019-07-26 DIAGNOSIS — R059 Cough, unspecified: Secondary | ICD-10-CM

## 2019-07-26 HISTORY — DX: Other specified conditions associated with female genital organs and menstrual cycle: N94.89

## 2019-07-26 LAB — PREGNANCY, URINE: Preg Test, Ur: NEGATIVE

## 2019-07-26 MED ORDER — ONDANSETRON 4 MG PO TBDP: 4.0000 mg | ORAL_TABLET | Freq: Three times a day (TID) | ORAL | 0 refills | Status: AC | PRN

## 2019-07-26 MED ORDER — BENZONATATE 100 MG PO CAPS: 100.0000 mg | ORAL_CAPSULE | Freq: Three times a day (TID) | ORAL | 0 refills | Status: AC

## 2019-07-26 NOTE — ED Triage Notes (Signed)
Pt c/o CP, cough, body aches, fever day 2-NAD-steady gait

## 2019-07-26 NOTE — ED Provider Notes (Signed)
[facility] EMERGENCY DEPARTMENT Provider Note   CSN: [id number] Arrival date & time: 07/26/19  1933    History Chief Complaint  Patient presents with  . Chest Pain    Ashlee Bowen is a 29 y.o. female with past medical history significant for chronic pelvic pain, chronic lower back pain, dyspnea on exertion who presents for evaluation multiple complaints.  Patient with nonproductive cough, intermittent chest tightness when she coughs, body aches and pains, rhinorrhea.  She denies any Covid exposures.  She is been tolerating p.o. intake at home however admits to intermittent nausea.  She denies any headache, lightheadedness, dizziness, exertional or pleuritic chest pain, abdominal pain, unilateral leg swelling, redness or warmth.  No prior history of PE or DVT.  Denies chance of pregnancy.  Has not taken anything for her symptoms.  States she is also had chills at home however is not taken her temperature.  Denies additional aggravating or alleviating factors.  States symptoms similar to presentation at [facility] on 06/15/19. Negative lab work up including trop and negative CTA chest without PE.  History obtained from patient and past medical records.  No interpreter is used.  HPI     Past Medical History:  Diagnosis Date  . Medical history non-contributory   . Pelvic congestion syndrome   . Varicose vein of leg     There are no problems to display for this patient.   Past Surgical History:  Procedure Laterality Date  . NO PAST SURGERIES       OB History    Gravida  3   Para  1   Term  1   Preterm      AB  1   Living  1     SAB  1   TAB      Ectopic      Multiple      Live Births              No family history on file.  Social History   Tobacco Use  . Smoking status: Former Research scientist (life sciences)  . Smokeless tobacco: Never Used  Substance Use Topics  . Alcohol use: Yes    Comment: occ  . Drug use: Not Currently    Home Medications Prior to  Admission medications   Medication Sig Start Date End Date Taking? Authorizing Provider  benzonatate (TESSALON) 100 MG capsule Take 1 capsule (100 mg total) by mouth every 8 (eight) hours. 07/26/19   Jermiyah Ricotta A, PA-C  IRON PO Take by mouth.    [provider]  ondansetron (ZOFRAN ODT) 4 MG disintegrating tablet Take 1 tablet (4 mg total) by mouth every 8 (eight) hours as needed for nausea or vomiting. 07/26/19   Jermon Chalfant A, PA-C  Prenatal Vit-Fe Fumarate-FA (PRENATAL VITAMIN PO) Take by mouth.    [provider]  Terconazole (TERAZOL 3 VA) Place vaginally.    [provider]    Allergies    Patient has no known allergies.  Review of Systems   Review of Systems  Constitutional: Positive for activity change, appetite change, chills and fatigue.  HENT: Positive for congestion, postnasal drip and rhinorrhea.   Respiratory: Positive for cough, chest tightness and shortness of breath. Negative for apnea, choking and stridor.   Cardiovascular: Positive for chest pain (With cough). Negative for palpitations and leg swelling.  Gastrointestinal: Positive for nausea. Negative for abdominal distention, abdominal pain, anal bleeding, blood in stool, constipation, diarrhea, rectal pain and vomiting.  Genitourinary: Negative.   Musculoskeletal: Negative.   Skin: Negative.   Neurological: Negative.   All other systems reviewed and are negative.   Physical Exam Updated Vital Signs BP (!) 143/101 (BP Location: Right Arm)   Pulse 86   Temp 98.3 F (36.8 C) (Oral)   Resp 18   Ht 5\' 3"  (1.6 m)   Wt 68.5 kg   LMP 07/15/2019   SpO2 100%   BMI 26.75 kg/m   Physical Exam Vitals and nursing note reviewed.  Constitutional:      General: She is not in acute distress.    Appearance: She is not ill-appearing, toxic-appearing or diaphoretic.  HENT:     Head: Normocephalic and atraumatic.     Jaw: There is normal jaw occlusion.     Right Ear: Tympanic membrane,  ear canal and external ear normal. There is no impacted cerumen. No hemotympanum. Tympanic membrane is not injected, scarred, perforated, erythematous, retracted or bulging.     Left Ear: Tympanic membrane, ear canal and external ear normal. There is no impacted cerumen. No hemotympanum. Tympanic membrane is not injected, scarred, perforated, erythematous, retracted or bulging.     Ears:     Comments: No Mastoid tenderness.    Nose:     Comments: Clear rhinorrhea and congestion to bilateral nares.  No sinus tenderness.    Mouth/Throat:     Comments: Posterior oropharynx clear.  Mucous membranes moist.  Tonsils without erythema or exudate.  Uvula midline without deviation.  No evidence of PTA or RPA.  No drooling, dysphasia or trismus.  Phonation normal. Neck:     Trachea: Trachea and phonation normal.     Meningeal: Brudzinski's sign and Kernig's sign absent.     Comments: No Neck stiffness or neck rigidity.  No meningismus.  No cervical lymphadenopathy. Cardiovascular:     Rate and Rhythm: Normal rate.     Comments: No murmurs rubs or gallops. Pulmonary:     Effort: Pulmonary effort is normal.     Breath sounds: Normal breath sounds.     Comments: Clear to auscultation bilaterally without wheeze, rhonchi or rales.  No accessory muscle usage.  Able speak in full sentences. Abdominal:     General: Bowel sounds are normal.     Palpations: Abdomen is soft.     Comments: Soft, nontender without rebound or guarding.  No CVA tenderness.  Musculoskeletal:        General: Normal range of motion.     Cervical back: Normal range of motion.     Right lower leg: No tenderness. No edema.     Left lower leg: No tenderness. No edema.     Comments: Moves all 4 extremities without difficulty.  Lower extremities without edema, erythema or warmth.  Skin:    General: Skin is warm.     Capillary Refill: Capillary refill takes less than 2 seconds.     Comments: Brisk capillary refill.  No rashes or  lesions.  Neurological:     Mental Status: She is alert.     Comments: Ambulatory in department without difficulty.  Cranial nerves II through XII grossly intact.  No facial droop.  No aphasia.     ED Results / Procedures / Treatments   Labs (all labs ordered are listed, but only abnormal results are displayed) Labs Reviewed  SARS CORONAVIRUS 2 (TAT 6-24 HRS)  PREGNANCY, URINE    EKG EKG Interpretation  Date/Time:  Wednesday July 26 2019 19:45:43 EDT Ventricular Rate:  80 PR Interval:    QRS Duration: 85 QT Interval:  365 QTC Calculation: 421 R Axis:   79 Text Interpretation: Sinus rhythm Normal ECG Confirmed by Geoffery Lyons ([id number]) on 07/26/2019 8:26:35 PM   Radiology DG Chest Portable 1 View  Result Date: 07/26/2019 CLINICAL DATA:  Cough EXAM: PORTABLE CHEST 1 VIEW COMPARISON:  09/16/2012 FINDINGS: The heart size and mediastinal contours are within normal limits. Both lungs are clear. The visualized skeletal structures are unremarkable. IMPRESSION: No active disease. Electronically Signed   By: Charlett Nose M.D.   On: 07/26/2019 20:49    Procedures Procedures (including critical care time)  Medications Ordered in ED Medications - No data to display  ED Course  I have reviewed the triage vital signs and the nursing notes.  Pertinent labs & imaging results that were available during my care of the patient were reviewed by me and considered in my medical decision making (see chart for details).  29 year old female presents for evaluation of upper respiratory complaints with cough, chest tightness when she coughs, intermittent shortness of breath as well as myalgias, rhinorrhea and chills.  No known Covid exposures.  Was seen at [facility] February for similar complaints and had negative CTA of chest.  There is no exertional or pleuritic component to this.  Does not radiate into left arm, left jaw or back.  No associated diaphoresis, emesis.  No history of  hypertension, hyperlipidemia, diabetes or family history at early age of ACS.  Heart and lungs are clear.  Abdomen is soft.  Personally ambulated patient in room with oxygen saturation 100% on room air without any dyspnea.  She has no tachycardia, tachypnea or hypoxia.  She is PERC negative, Wells criteria low risk.  She denies any recent travel, immobilization, unilateral leg swelling or redness or warmth.  She has no evidence of DVT on exam.  Able to reproduce her chest pain on exam.  We will plan on getting EKG, chest x-ray and outpatient Covid test  EKG with ST/T changes.  No STEMI.  She has no tachycardia Chest xray without infiltrates, cardiomegaly, pulmonary edema, pneumothorax Pregnancy negative  Patient reassessed.  Tolerating p.o. intake without difficulty.  Likely viral illness given upper respiratory complaints.  I have low suspicion for ACS, PE, dissection, myocarditis, pericarditis, acute bacterial infectious process.  Will treat symptomatically and have her return for any new or worsening symptoms.  Discussed plan with patient which she is agreeable to.  The patient has been appropriately medically screened and/or stabilized in the ED. I have low suspicion for any other emergent medical condition which would require further screening, evaluation or treatment in the ED or require inpatient management.  Patient is hemodynamically stable and in no acute distress.  Patient able to ambulate in department prior to ED.  Evaluation does not show acute pathology that would require ongoing or additional emergent interventions while in the emergency department or further inpatient treatment.  I have discussed the diagnosis with the patient and answered all questions.  Pain is been managed while in the emergency department and patient has no further complaints prior to discharge.  Patient is comfortable with plan discussed in room and is stable for discharge at this time.  I have discussed strict return  precautions for returning to the emergency department.  Patient was encouraged to follow-up with PCP/specialist refer to at discharge.     MDM Rules/Calculators/A&P  Ashlee Bowen was evaluated in Emergency Department on 07/26/2019 for the symptoms described in the history of present illness. She was evaluated in the context of the global COVID-19 pandemic, which necessitated consideration that the patient might be at risk for infection with the SARS-CoV-2 virus that causes COVID-19. Institutional protocols and algorithms that pertain to the evaluation of patients at risk for COVID-19 are in a state of rapid change based on information released by regulatory bodies including the CDC and federal and state organizations. These policies and algorithms were followed during the patient's care in the ED. Final Clinical Impression(s) / ED Diagnoses Final diagnoses:  Chest wall pain  Cough  COVID-19 virus test result unknown    Rx / DC Orders ED Discharge Orders         Ordered    ondansetron (ZOFRAN ODT) 4 MG disintegrating tablet  Every 8 hours PRN     07/26/19 2103    benzonatate (TESSALON) 100 MG capsule  Every 8 hours     07/26/19 2104           Itha Kroeker A, PA-C 07/26/19 2104    Geoffery Lyons, MD 07/26/19 2312

## 2019-07-26 NOTE — Discharge Instructions (Addendum)
Your EKG and chest x-ray were reassuring.  This is likely a viral illness.  We have tested you for Covid.  You will be called in 1 to 2 days if this is positive.  I have written a prescription for Zofran which will help with your nausea.  I suggest rest and drinking plenty of fluids.  Need to be out of work until [employer] has resulted.  If positive you will need to be out of work for 1 week

## 2019-07-26 NOTE — ED Notes (Signed)
Delay in triage/EKG due to pt need to use BR

## 2019-07-27 LAB — SARS CORONAVIRUS 2 (TAT 6-24 HRS): SARS Coronavirus 2: NEGATIVE

## 2022-09-29 ENCOUNTER — Other Ambulatory Visit: Payer: Self-pay

## 2022-09-29 ENCOUNTER — Emergency Department (HOSPITAL_BASED_OUTPATIENT_CLINIC_OR_DEPARTMENT_OTHER)
Admission: EM | Admit: 2022-09-29 | Discharge: 2022-09-29 | Disposition: A | Discharge status: Home / Self Care | Payer: No Typology Code available for payment source | Attending: Emergency Medicine | Admitting: Emergency Medicine

## 2022-09-29 ENCOUNTER — Encounter (HOSPITAL_BASED_OUTPATIENT_CLINIC_OR_DEPARTMENT_OTHER): Payer: Self-pay | Admitting: Urology

## 2022-09-29 DIAGNOSIS — Y92410 Unspecified street and highway as the place of occurrence of the external cause: Secondary | ICD-10-CM | POA: Insufficient documentation

## 2022-09-29 DIAGNOSIS — S161XXA Strain of muscle, fascia and tendon at neck level, initial encounter: Secondary | ICD-10-CM | POA: Diagnosis not present

## 2022-09-29 DIAGNOSIS — S199XXA Unspecified injury of neck, initial encounter: Secondary | ICD-10-CM | POA: Diagnosis present

## 2022-09-29 NOTE — ED Notes (Signed)
ED Provider at bedside. 

## 2022-09-29 NOTE — Discharge Instructions (Addendum)
I recommend Tylenol 1000 mg every 6 hours for pain, hydrate, rest and make sure you are moving regularly and doing some gentle stretching and massage of your neck.
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
## 2023-01-07 ENCOUNTER — Emergency Department (HOSPITAL_BASED_OUTPATIENT_CLINIC_OR_DEPARTMENT_OTHER)
Admission: EM | Admit: 2023-01-07 | Discharge: 2023-01-07 | Disposition: A | Discharge status: Home / Self Care | Payer: Medicaid Other | Attending: Emergency Medicine | Admitting: Emergency Medicine

## 2023-01-07 ENCOUNTER — Other Ambulatory Visit: Payer: Self-pay

## 2023-01-07 ENCOUNTER — Encounter (HOSPITAL_BASED_OUTPATIENT_CLINIC_OR_DEPARTMENT_OTHER): Payer: Self-pay | Admitting: Emergency Medicine

## 2023-01-07 DIAGNOSIS — K649 Unspecified hemorrhoids: Secondary | ICD-10-CM | POA: Diagnosis not present

## 2023-01-07 DIAGNOSIS — K6289 Other specified diseases of anus and rectum: Secondary | ICD-10-CM | POA: Diagnosis present

## 2023-01-07 LAB — COMPREHENSIVE METABOLIC PANEL
ALT: 36 U/L (ref 0–44)
AST: 23 U/L (ref 15–41)
Albumin: 4.3 g/dL (ref 3.5–5.0)
Alkaline Phosphatase: 67 U/L (ref 38–126)
Anion gap: 9 (ref 5–15)
BUN: 11 mg/dL (ref 6–20)
CO2: 26 mmol/L (ref 22–32)
Calcium: 9.3 mg/dL (ref 8.9–10.3)
Chloride: 102 mmol/L (ref 98–111)
Creatinine, Ser: 0.56 mg/dL (ref 0.44–1.00)
GFR, Estimated: >60 mL/min (ref >60)
Glucose, Bld: 117 mg/dL — ABNORMAL HIGH (ref 70–99)
Potassium: 3.6 mmol/L (ref 3.5–5.1)
Sodium: 137 mmol/L (ref 135–145)
Total Bilirubin: 0.5 mg/dL (ref 0.3–1.2)
Total Protein: 7.7 g/dL (ref 6.5–8.1)

## 2023-01-07 LAB — URINALYSIS, ROUTINE W REFLEX MICROSCOPIC
Bilirubin Urine: NEGATIVE
Glucose, UA: NEGATIVE mg/dL
Hgb urine dipstick: NEGATIVE
Ketones, ur: NEGATIVE mg/dL
Leukocytes,Ua: NEGATIVE
Nitrite: NEGATIVE
Protein, ur: NEGATIVE mg/dL
Specific Gravity, Urine: 1.02 (ref 1.005–1.030)
pH: 7 (ref 5.0–8.0)

## 2023-01-07 LAB — CBC
HCT: 40.6 % (ref 36.0–46.0)
Hemoglobin: 12.8 g/dL (ref 12.0–15.0)
MCH: 25.4 pg — ABNORMAL LOW (ref 26.0–34.0)
MCHC: 31.5 g/dL (ref 30.0–36.0)
MCV: 80.6 fL (ref 80.0–100.0)
Platelets: 254 10*3/uL (ref 150–400)
RBC: 5.04 MIL/uL (ref 3.87–5.11)
RDW: 13.6 % (ref 11.5–15.5)
WBC: 8.1 10*3/uL (ref 4.0–10.5)
nRBC: 0 % (ref 0.0–0.2)

## 2023-01-07 LAB — ABO/RH: ABO/RH(D): B POS

## 2023-01-07 LAB — PREGNANCY, URINE: Preg Test, Ur: NEGATIVE

## 2023-01-07 MED ORDER — PHENYLEPHRINE-MINERAL OIL-PET 0.25-14-74.9 % RE OINT: 1.0000 | TOPICAL_OINTMENT | Freq: Two times a day (BID) | RECTAL | 0 refills | Status: AC | PRN

## 2023-01-07 MED ORDER — SENNOSIDES-DOCUSATE SODIUM 8.6-50 MG PO TABS: 1.0000 | ORAL_TABLET | Freq: Every day | ORAL | 0 refills | Status: AC

## 2023-01-07 MED ORDER — HYDROCORTISONE 1 % EX CREA: TOPICAL_CREAM | CUTANEOUS | 0 refills | Status: DC

## 2023-01-07 NOTE — Discharge Instructions (Addendum)
You were evaluated in the emergency department for concern of bright red blood in your stool.  The blood that is found in your stool is likely from a hemorrhoid that was causing pain while defecating and bleeding.  We have prescribed you a cream that you can use once a day, apply to the hemorrhoid. We also prescribed stool softeners, please take one tablet per day. Please follow up with PCP in 1 week.

## 2023-01-07 NOTE — ED Triage Notes (Signed)
Pt reports blood in stool this morning x 2, denies hx of hemorrhoids, reports suprapubic ABD pain

## 2023-01-07 NOTE — ED Provider Notes (Signed)
[facility] AT [facility] [place] Provider Note   CSN: [id number] Arrival date & time: 01/07/23  1633     History  No chief complaint on file.   Ashlee Bowen is a 32 y.o. female with a past medical history of uterine fibroids who presents to emergency department for evaluation of blood in her stool.  Patient stated that this morning when she used the restroom, she noticed bright red blood in her stool 2 times.  She stated that during one of the bowel movements she did have rectal discomfort and pain.  Patient denies blood thinners, melena, constipation, straining while having bowel movements, NSAID use, or history of IBD including self and family.  Patient reports a suprapubic abdominal discomfort described as cramping but denies menstrual cycle at this moment.  She reports increased frequency with urination but denies hematuria.       Home Medications Prior to Admission medications   Medication Sig Start Date End Date Taking? Authorizing Provider  phenylephrine-shark liver oil-mineral oil-petrolatum (PREPARATION H) 0.25-14-74.9 % rectal ointment Place 1 Application rectally 2 (two) times daily as needed for hemorrhoids. 01/07/23  Yes Izaya Netherton, DO  senna-docusate (SENOKOT-S) 8.6-50 MG tablet Take 1 tablet by mouth daily. 01/07/23  Yes Dan Scearce, Irving Burton, DO  benzonatate (TESSALON) 100 MG capsule Take 1 capsule (100 mg total) by mouth every 8 (eight) hours. 07/26/19   Henderly, Britni A, PA-C  IRON PO Take by mouth.    [provider]  ondansetron (ZOFRAN ODT) 4 MG disintegrating tablet Take 1 tablet (4 mg total) by mouth every 8 (eight) hours as needed for nausea or vomiting. 07/26/19   Henderly, Britni A, PA-C  Prenatal Vit-Fe Fumarate-FA (PRENATAL VITAMIN PO) Take by mouth.    [provider]  Terconazole (TERAZOL 3 VA) Place vaginally.    [provider]      Allergies    Patient has no known allergies.    Review of Systems   Review of  Systems  Constitutional:  Negative for chills and fever.  Gastrointestinal:  Positive for abdominal pain, blood in stool and rectal pain. Negative for constipation, diarrhea, nausea and vomiting.  Genitourinary:  Positive for frequency and pelvic pain. Negative for dysuria and hematuria.    Physical Exam Updated Vital Signs BP 108/68 (BP Location: Left Arm)   Pulse 65   Temp 98.4 F (36.9 C) (Oral)   Resp 16   Ht 5\' 3"  (1.6 m)   Wt 73.9 kg   LMP 12/10/2022 (Approximate)   SpO2 98%   BMI 28.87 kg/m  Physical Exam Vitals reviewed. Exam conducted with a chaperone present.  Constitutional:      General: She is not in acute distress.    Appearance: She is obese. She is not ill-appearing, toxic-appearing or diaphoretic.  HENT:     Head: Normocephalic.  Cardiovascular:     Rate and Rhythm: Normal rate and regular rhythm.     Heart sounds: Normal heart sounds.  Pulmonary:     Effort: Pulmonary effort is normal.     Breath sounds: Normal breath sounds.  Abdominal:     General: Abdomen is protuberant. Bowel sounds are normal. There is no distension.     Palpations: Abdomen is soft.     Tenderness: There is abdominal tenderness in the suprapubic area. There is no guarding.     Comments: During rectal exam with chaperone present, there is a small nonbleeding hemorrhoid at the 3 o'clock position.  There is no  melena or bright red blood during examination.  Musculoskeletal:     Right lower leg: No edema.     Left lower leg: No edema.  Skin:    General: Skin is warm and dry.  Neurological:     Mental Status: She is alert and oriented to person, place, and time.  Psychiatric:        Mood and Affect: Mood normal.     ED Results / Procedures / Treatments   Labs (all labs ordered are listed, but only abnormal results are displayed) Labs Reviewed  COMPREHENSIVE METABOLIC PANEL - Abnormal; Notable for the following components:      Result Value   Glucose, Bld 117 (*)    All other  components within normal limits  CBC - Abnormal; Notable for the following components:   MCH 25.4 (*)    All other components within normal limits  PREGNANCY, URINE  URINALYSIS, ROUTINE W REFLEX MICROSCOPIC  POC OCCULT BLOOD, ED  ABO/RH    EKG None  Radiology No results found.  Procedures Procedures    Medications Ordered in ED Medications - No data to display  ED Course/ Medical Decision Making/ A&P                                 Medical Decision Making Patient is a 32 year old female who presents emergency department with a evaluation for bright red blood in her stool today.  Initial evaluation, patient is well-appearing with blood pressure of 108/68 and a pulse rate of 65.  Physical exam finding during rectal exam does show an external hernia located at the 3 o'clock position, there is no melena or bright red blood.  The bright red blood in the stool is most likely caused from a external hemorrhoid, other etiologies considered such as acute GI bleed, hematuria, menstrual cycle and abnormal uterine bleeding were considered.  Her hemoglobin is 12.8, and vital signs are stable.  Patient was discharged home in stable condition with instructions to use Preparation H and stool softeners along with instructions to not sit on the toilet for long periods of time and to increase dietary fiber intake.   Amount and/or Complexity of Data Reviewed Labs: ordered.    Details: CBC no anemia, Hgb 12.8 CMP stable  UA WNL Pregnancy urine, negative  Risk OTC drugs.           Final Clinical Impression(s) / ED Diagnoses Final diagnoses:  Hemorrhoids, unspecified hemorrhoid type    Rx / DC Orders ED Discharge Orders          Ordered    hydrocortisone cream 1 %  Status:  Discontinued        01/07/23 2236    senna-docusate (SENOKOT-S) 8.6-50 MG tablet  Daily        01/07/23 2236    phenylephrine-shark liver oil-mineral oil-petrolatum (PREPARATION H) 0.25-14-74.9 % rectal  ointment  2 times daily PRN        01/07/23 2316              Faith Rogue, DO 01/07/23 2317    Melene Plan, DO 01/07/23 2320
